# Patient Record
Sex: Male | Born: 1990 | Race: Black or African American | Hispanic: No | Marital: Single | Smoking: Never smoker
Health system: Southern US, Community
[De-identification: ages and names within clinical notes are randomized; demographics above are authoritative.]

---

## 2016-12-08 ENCOUNTER — Emergency Department (HOSPITAL_BASED_OUTPATIENT_CLINIC_OR_DEPARTMENT_OTHER)
Admission: EM | Admit: 2016-12-08 | Discharge: 2016-12-08 | Disposition: A | Discharge status: Home / Self Care | Payer: Self-pay | Attending: Emergency Medicine | Admitting: Emergency Medicine

## 2016-12-08 ENCOUNTER — Encounter (HOSPITAL_BASED_OUTPATIENT_CLINIC_OR_DEPARTMENT_OTHER): Payer: Self-pay

## 2016-12-08 DIAGNOSIS — R112 Nausea with vomiting, unspecified: Secondary | ICD-10-CM | POA: Insufficient documentation

## 2016-12-08 DIAGNOSIS — R4182 Altered mental status, unspecified: Secondary | ICD-10-CM | POA: Insufficient documentation

## 2016-12-08 LAB — URINALYSIS, ROUTINE W REFLEX MICROSCOPIC
Bilirubin Urine: NEGATIVE
GLUCOSE, UA: NEGATIVE mg/dL
Hgb urine dipstick: NEGATIVE
LEUKOCYTES UA: NEGATIVE
NITRITE: NEGATIVE
PROTEIN: 30 mg/dL — AB
Specific Gravity, Urine: 1.03 (ref 1.005–1.030)
pH: 8 (ref 5.0–8.0)

## 2016-12-08 LAB — CBC
HCT: 46.5 % (ref 39.0–52.0)
Hemoglobin: 16.6 g/dL (ref 13.0–17.0)
MCH: 30.9 pg (ref 26.0–34.0)
MCHC: 35.7 g/dL (ref 30.0–36.0)
MCV: 86.6 fL (ref 78.0–100.0)
PLATELETS: 224 10*3/uL (ref 150–400)
RBC: 5.37 MIL/uL (ref 4.22–5.81)
RDW: 12 % (ref 11.5–15.5)
WBC: 15.6 10*3/uL — ABNORMAL HIGH (ref 4.0–10.5)

## 2016-12-08 LAB — COMPREHENSIVE METABOLIC PANEL
ALK PHOS: 39 U/L (ref 38–126)
ALT: 17 U/L (ref 17–63)
AST: 38 U/L (ref 15–41)
Albumin: 4.7 g/dL (ref 3.5–5.0)
Anion gap: 12 (ref 5–15)
BILIRUBIN TOTAL: 2.8 mg/dL — AB (ref 0.3–1.2)
BUN: 13 mg/dL (ref 6–20)
CALCIUM: 9.6 mg/dL (ref 8.9–10.3)
CO2: 25 mmol/L (ref 22–32)
CREATININE: 1.27 mg/dL — AB (ref 0.61–1.24)
Chloride: 99 mmol/L — ABNORMAL LOW (ref 101–111)
GFR calc non Af Amer: >60 mL/min (ref >60)
Glucose, Bld: 116 mg/dL — ABNORMAL HIGH (ref 65–99)
Potassium: 3.6 mmol/L (ref 3.5–5.1)
SODIUM: 136 mmol/L (ref 135–145)
TOTAL PROTEIN: 7.5 g/dL (ref 6.5–8.1)

## 2016-12-08 LAB — ETHANOL

## 2016-12-08 LAB — URINALYSIS, MICROSCOPIC (REFLEX)
RBC / HPF: NONE SEEN RBC/hpf (ref 0–5)
WBC UA: NONE SEEN WBC/hpf (ref 0–5)

## 2016-12-08 LAB — RAPID URINE DRUG SCREEN, HOSP PERFORMED
AMPHETAMINES: NOT DETECTED
BENZODIAZEPINES: NOT DETECTED
Barbiturates: NOT DETECTED
COCAINE: NOT DETECTED
OPIATES: NOT DETECTED
Tetrahydrocannabinol: POSITIVE — AB

## 2016-12-08 LAB — LIPASE, BLOOD: LIPASE: 25 U/L (ref 11–51)

## 2016-12-08 MED ORDER — ONDANSETRON 4 MG PO TBDP: 4.0000 mg | ORAL_TABLET | Freq: Three times a day (TID) | ORAL | 0 refills | Status: AC | PRN

## 2016-12-08 MED ORDER — GI COCKTAIL ~~LOC~~
30.0000 mL | Freq: Once | ORAL | Status: DC
  Filled 2016-12-08: qty 30

## 2016-12-08 MED ORDER — SODIUM CHLORIDE 0.9 % IV BOLUS (SEPSIS)
1000.0000 mL | Freq: Once | INTRAVENOUS | Status: AC
  Administered 2016-12-08: 1000 mL via INTRAVENOUS

## 2016-12-08 MED ORDER — ONDANSETRON HCL 4 MG/2ML IJ SOLN
4.0000 mg | Freq: Once | INTRAMUSCULAR | Status: AC
  Administered 2016-12-08: 4 mg via INTRAVENOUS
  Filled 2016-12-08: qty 2

## 2016-12-08 NOTE — ED Notes (Signed)
Sprite given per order 

## 2016-12-08 NOTE — ED Provider Notes (Signed)
[facility]   CSN: [id number] Arrival date & time: 12/08/16  1818  By signing my name below, I, Vista Minkobert Ross, attest that this documentation has been prepared under the direction and in the presence of Mia McDonald PA-C  Electronically Signed: Vista Minkobert Ross, ED Scribe. 12/08/16. 7:21 PM.  History   Chief Complaint Chief Complaint  Patient presents with  . Abdominal Pain  . Altered Mental Status   HPI HPI Comments: Erik Ponce is a 26 y.o. male who presents to the Emergency Department complaining of 8/10, crampy abdominal pain that started around 0300 this morning. Pt reports bright green vomit since 0300 this morning, which has reoccurred once per hour since his symptoms started. Pt has had one episode of bright red blood in his vomit today. He has had decreased oral intake; brother states that he has had trouble keeping fluids or food down. His last BM was this morning around 0900, reports that this was a hard BM. He is still currently feeling nauseas. He had similar symptoms approximately two months ago with food poisoning. Pt further reports chills, no fever on arrival here. Brother reports pt was drinking last night, only 1/4 of a 40oz beer. He has not had any problems with constipation. No blood in BM.  No diarrhea. No other known sick contacts. No recent travel outside of the country. No daily medications. He denies any illicit drug use. No dysuria, penile discharge.  The history is provided by the patient. No language interpreter was used.    History reviewed. No pertinent past medical history.  There are no active problems to display for this patient.   History reviewed. No pertinent surgical history.   Home Medications    Prior to Admission medications   Medication Sig Start Date End Date Taking? Authorizing Provider  ondansetron (ZOFRAN ODT) 4 MG disintegrating tablet Take 1 tablet (4 mg total) by mouth every 8 (eight) hours as needed for nausea  or vomiting. 12/08/16   McDonald, Mia A, PA-C    Family History No family history on file.  Social History Social History  Substance Use Topics  . Smoking status: Never Smoker  . Smokeless tobacco: Never Used  . Alcohol use Yes    Allergies   Patient has no known allergies.   Review of Systems Review of Systems  Constitutional: Positive for chills. Negative for fever.  Gastrointestinal: Positive for nausea and vomiting. Negative for blood in stool and diarrhea.  Genitourinary: Negative for discharge and dysuria.     Physical Exam Updated Vital Signs BP 133/75 (BP Location: Right Arm)   Pulse 80   Temp 99.3 F (37.4 C) (Oral)   Resp (!) 24   Ht 5\' 10"  (1.778 m)   Wt 160 lb (72.6 kg)   SpO2 100%   BMI 22.96 kg/m   Physical Exam  Constitutional: He appears well-developed.  Appears uncomfortable  HENT:  Head: Normocephalic.  Eyes: Conjunctivae are normal.  Neck: Neck supple.  Cardiovascular: Normal rate, regular rhythm, normal heart sounds and intact distal pulses.  Exam reveals no gallop and no friction rub.   No murmur heard. Pulmonary/Chest: Effort normal and breath sounds normal. No respiratory distress. He has no wheezes. He has no rales.  Mildly tachypneic.   Abdominal: Soft. Bowel sounds are normal. He exhibits no distension. There is tenderness. There is no rebound and no guarding.  Diffuse tenderness. No focal tenderness. No CVA tenderness.   Neurological: He is alert.  Skin: Skin is  warm and dry. Capillary refill takes 2 to 3 seconds.  Psychiatric: His behavior is normal.  Nursing note and vitals reviewed.  ED Treatments / Results  DIAGNOSTIC STUDIES: Oxygen Saturation is 100% on RA, normal by my interpretation.  COORDINATION OF CARE: 7:16 PM-Discussed treatment plan with pt at bedside and pt agreed to plan.   Labs (all labs ordered are listed, but only abnormal results are displayed) Labs Reviewed  URINALYSIS, ROUTINE W REFLEX MICROSCOPIC -  Abnormal; Notable for the following:       Result Value   Ketones, ur >80 (*)    Protein, ur 30 (*)    All other components within normal limits  RAPID URINE DRUG SCREEN, HOSP PERFORMED - Abnormal; Notable for the following:    Tetrahydrocannabinol POSITIVE (*)    All other components within normal limits  COMPREHENSIVE METABOLIC PANEL - Abnormal; Notable for the following:    Chloride 99 (*)    Glucose, Bld 116 (*)    Creatinine, Ser 1.27 (*)    Total Bilirubin 2.8 (*)    All other components within normal limits  CBC - Abnormal; Notable for the following:    WBC 15.6 (*)    All other components within normal limits  URINALYSIS, MICROSCOPIC (REFLEX) - Abnormal; Notable for the following:    Bacteria, UA RARE (*)    Squamous Epithelial / LPF 0-5 (*)    All other components within normal limits  ETHANOL  LIPASE, BLOOD  CBG MONITORING, ED    EKG  EKG Interpretation None       Radiology No results found.  Procedures Procedures (including critical care time)  Medications Ordered in ED Medications  gi cocktail (Maalox,Lidocaine,Donnatal) (30 mLs Oral Not Given 12/08/16 2230)  sodium chloride 0.9 % bolus 1,000 mL (0 mLs Intravenous Stopped 12/08/16 2011)  ondansetron (ZOFRAN) injection 4 mg (4 mg Intravenous Given 12/08/16 1920)  sodium chloride 0.9 % bolus 1,000 mL (0 mLs Intravenous Stopped 12/08/16 2222)     Initial Impression / Assessment and Plan / ED Course  I have reviewed the triage vital signs and the nursing notes.  Pertinent labs & imaging results that were available during my care of the patient were reviewed by me and considered in my medical decision making (see chart for details).     Patient is nontoxic, nonseptic appearing, in no apparent distress.  Patient's pain and other symptoms adequately managed in emergency department.  Fluid bolus given.  Labs, imaging and vitals reviewed.  Cr. 1.27. Cl 99. Leukocytosis of 15.6, likely due to hemodilution  secondary to dehydration. The patient reports drinking alcohol heavily last night. Patient does not meet the SIRS or Sepsis criteria.  On repeat exam patient does not have a surgical abdomen and there are no peritoneal signs.  No indication of appendicitis, bowel obstruction, bowel perforation, cholecystitis, diverticulitis.  Successfully PO challenged. Patient discharged home with symptomatic treatment and given strict instructions for follow-up with their primary care physician.  I have also discussed reasons to return immediately to the ER.  Patient expresses understanding and agrees with plan.  Final Clinical Impressions(s) / ED Diagnoses   Final diagnoses:  Nausea and vomiting, intractability of vomiting not specified, unspecified vomiting type    New Prescriptions Discharge Medication List as of 12/08/2016 10:51 PM    START taking these medications   Details  ondansetron (ZOFRAN ODT) 4 MG disintegrating tablet Take 1 tablet (4 mg total) by mouth every 8 (eight) hours as needed for nausea  or vomiting., Starting Mon 12/08/2016, Print      I personally performed the services described in this documentation, which was scribed in my presence. The recorded information has been reviewed and is accurate.     Frederik Pear A, PA-C 12/09/16 0140    Rolan Bucco, MD 12/11/16 [phone]

## 2016-12-08 NOTE — ED Notes (Signed)
Pt still withdrawn but will answer questions. Keeps his head under blanket while speaking with RN. RN requested urine sample. Pt states he cannot void right now. 1L NS bolus hung per order. Specimen cup left for patient to use when he is able to void.

## 2016-12-08 NOTE — ED Notes (Signed)
RN requesting urine sample. Pt states he is unable to void at this time. 1L NS bolus hung per order. Pt reminded to call when he is able to urinate.

## 2016-12-08 NOTE — ED Notes (Signed)
Pt able to drink fluids without vomiting. EDP notified. Pt wishes to be discharged.

## 2016-12-08 NOTE — Discharge Instructions (Signed)
Please continue to stay hydrated and drink plenty of liquids. You can take Zofran once every 8 hours to help with nausea and vomiting. If he develop nausea or vomiting despite taking Zofran, fever, chills or new or worsening symptoms, please return to the emergency department for reevaluation.  If you are not established with a primary care provider, you can call [facility] to get established with primary care. There was a small amount of protein and ketones in your urine today. Please follow up and have your urinalysis repeated in the next month with primary care.

## 2016-12-08 NOTE — ED Notes (Signed)
ED Provider at bedside. 

## 2016-12-08 NOTE — ED Notes (Addendum)
Patient ambulatory to bathroom with brother.  Patient stumbling.  Offered wheelchair-patient refused.  Patient states he isn't going to the bathroom and that he just wants to get up and walk.  Patient in bathroom with brother.  Refused help from RN.  On return from bathroom patient states "I am not peeing in that cup for you".  Patient back in bed with brother at the bedside.

## 2016-12-08 NOTE — ED Triage Notes (Addendum)
C/o abd pain, n/v started yesterday-presents to triage in w/c-bent over eyes closed not answering my ?s-pt's brother answering ?s-pt did eventually talk to staff at times with brother answering most ?s at pt's request

## 2017-06-09 ENCOUNTER — Ambulatory Visit: Payer: Self-pay | Admitting: Nurse Practitioner

## 2017-06-09 VITALS — BP 110/80 | HR 98 | Temp 98.8°F | Resp 16 | Wt 232.0 lb

## 2017-06-09 DIAGNOSIS — J029 Acute pharyngitis, unspecified: Secondary | ICD-10-CM

## 2017-06-09 DIAGNOSIS — J309 Allergic rhinitis, unspecified: Secondary | ICD-10-CM

## 2017-06-09 NOTE — Patient Instructions (Addendum)
Allergic Rhinitis, Adult Allergic rhinitis is an allergic reaction that affects the mucous membrane inside the nose. It causes sneezing, a runny or stuffy nose, and the feeling of mucus going down the back of the throat (postnasal drip). Allergic rhinitis can be mild to severe. There are two types of allergic rhinitis:  Seasonal. This type is also called hay fever. It happens only during certain seasons.  Perennial. This type can happen at any time of the year.  What are the causes? This condition happens when the body's defense system (immune system) responds to certain harmless substances called allergens as though they were germs.  Seasonal allergic rhinitis is triggered by pollen, which can come from grasses, trees, and weeds. Perennial allergic rhinitis may be caused by:  House dust mites.  Pet dander.  Mold spores.  What are the signs or symptoms? Symptoms of this condition include:  Sneezing.  Runny or stuffy nose (nasal congestion).  Postnasal drip.  Itchy nose.  Tearing of the eyes.  Trouble sleeping.  Daytime sleepiness.  How is this diagnosed? This condition may be diagnosed based on:  Your medical history.  A physical exam.  Tests to check for related conditions, such as: ? Asthma. ? Pink eye. ? Ear infection. ? Upper respiratory infection.  Tests to find out which allergens trigger your symptoms. These may include skin or blood tests.  How is this treated? There is no cure for this condition, but treatment can help control symptoms. Treatment may include:  Taking medicines that block allergy symptoms, such as antihistamines. Medicine may be given as a shot, nasal spray, or pill.  Avoiding the allergen.  Desensitization. This treatment involves getting ongoing shots until your body becomes less sensitive to the allergen. This treatment may be done if other treatments do not help.  If taking medicine and avoiding the allergen does not work, new,  stronger medicines may be prescribed.  Follow these instructions at home:  Find out what you are allergic to. Common allergens include smoke, dust, and pollen.  Avoid the things you are allergic to. These are some things you can do to help avoid allergens: ? Replace carpet with wood, tile, or vinyl flooring. Carpet can trap dander and dust. ? Do not smoke. Do not allow smoking in your home. ? Change your heating and air conditioning filter at least once a month. ? During allergy season:  Keep windows closed as much as possible.  Plan outdoor activities when pollen counts are lowest. This is usually during the evening hours.  When coming indoors, change clothing and shower before sitting on furniture or bedding.  Take over-the-counter and prescription medicines only as told by your health care provider.  Keep all follow-up visits as told by your health care provider. This is important. Contact a health care provider if:  You have a fever.  You develop a persistent cough.  You make whistling sounds when you breathe (you wheeze).  Your symptoms interfere with your normal daily activities. Get help right away if:  You have shortness of breath. Summary  This condition can be managed by taking medicines as directed and avoiding allergens.  Contact your health care provider if you develop a persistent cough or fever.  During allergy season, keep windows closed as much as possible. This information is not intended to replace advice given to you by your health care provider. Make sure you discuss any questions you have with your health care provider. Document Released: 01/28/2001 Document Revised: 06/12/2016  Document Reviewed: 06/12/2016 Elsevier Interactive Patient Education  2018 [employer]  Pharyngitis Pharyngitis is redness, pain, and swelling (inflammation) of the throat (pharynx). It is a very common cause of sore throat. Pharyngitis can be caused by a bacteria, but it  is usually caused by a virus. Most cases of pharyngitis get better on their own without treatment. What are the causes? This condition may be caused by:  Infection by viruses (viral). Viral pharyngitis spreads from person to person (is contagious) through coughing, sneezing, and sharing of personal items or utensils such as cups, forks, spoons, and toothbrushes.  Infection by bacteria (bacterial). Bacterial pharyngitis may be spread by touching the nose or face after coming in contact with the bacteria, or through more intimate contact, such as kissing.  Allergies. Allergies can cause buildup of mucus in the throat (post-nasal drip), leading to inflammation and irritation. Allergies can also cause blocked nasal passages, forcing breathing through the mouth, which dries and irritates the throat.  What increases the risk? You are more likely to develop this condition if:  You are 625-26 years old.  You are exposed to crowded environments such as daycare, school, or dormitory living.  You live in a cold climate.  You have a weakened disease-fighting (immune) system.  What are the signs or symptoms? Symptoms of this condition vary by the cause (viral, bacterial, or allergies) and can include:  Sore throat.  Fatigue.  Low-grade fever.  Headache.  Joint pain and muscle aches.  Skin rashes.  Swollen glands in the throat (lymph nodes).  Plaque-like film on the throat or tonsils. This is often a symptom of bacterial pharyngitis.  Vomiting.  Stuffy nose (nasal congestion).  Cough.  Red, itchy eyes (conjunctivitis).  Loss of appetite.  How is this diagnosed? This condition is often diagnosed based on your medical history and a physical exam. Your health care provider will ask you questions about your illness and your symptoms. A swab of your throat may be done to check for bacteria (rapid strep test). Other lab tests may also be done, depending on the suspected cause, but these  are rare. How is this treated? This condition usually gets better in 3-4 days without medicine. Bacterial pharyngitis may be treated with antibiotic medicines. Follow these instructions at home:  Take over-the-counter and prescription medicines only as told by your health care provider. ? If you were prescribed an antibiotic medicine, take it as told by your health care provider. Do not stop taking the antibiotic even if you start to feel better. ? Do not give children aspirin because of the association with Reye syndrome.  Drink enough water and fluids to keep your urine clear or pale yellow.  Get a lot of rest.  Gargle with a salt-water mixture 3-4 times a day or as needed. To make a salt-water mixture, completely dissolve -1 tsp of salt in 1 cup of warm water.  If your health care provider approves, you may use throat lozenges or sprays to soothe your throat.  Take Ibuprofen 800mg  every 8 hours for three days.  Use humidifier at home for cough. Contact a health care provider if:  You have large, tender lumps in your neck.  You have a rash.  You cough up green, yellow-brown, or bloody spit. Get help right away if:  Your neck becomes stiff.  You drool or are unable to swallow liquids.  You cannot drink or take medicines without vomiting.  You have severe pain that does not go  away, even after you take medicine.  You have trouble breathing, and it is not caused by a stuffy nose.  You have new pain and swelling in your joints such as the knees, ankles, wrists, or elbows. Summary  Pharyngitis is redness, pain, and swelling (inflammation) of the throat (pharynx).  While pharyngitis can be caused by a bacteria, the most common causes are viral.  Most cases of pharyngitis get better on their own without treatment.  Bacterial pharyngitis is treated with antibiotic medicines. This information is not intended to replace advice given to you by your health care provider. Make  sure you discuss any questions you have with your health care provider. Document Released: 05/05/2005 Document Revised: 06/10/2016 Document Reviewed: 06/10/2016 Elsevier Interactive Patient Education  [employer].

## 2017-06-09 NOTE — Progress Notes (Signed)
Subjective:     Erik Ponce is a 27 y.o. male who presents for evaluation of sore throat. Associated symptoms include bilateral ear fullness and pressure, enlarged tonsils, sinus and nasal congestion and sore throat. Onset of symptoms was 4 days ago, and have been gradually worsening since that time. He is drinking plenty of fluids. He has not had a recent close exposure to someone with proven streptococcal pharyngitis. Patient has not taken any medications for his symptoms.  The following portions of the patient's history were reviewed and updated as appropriate: allergies, current medications and past medical history.  Review of Systems Constitutional: positive for anorexia and fatigue, negative for chills, fevers, malaise and sweats Eyes: negative Ears, nose, mouth, throat, and face: positive for nasal congestion, sore throat and ear pressure/fullness, negative for ear drainage and hoarseness Respiratory: positive for cough Cardiovascular: negative Neurological: positive for headaches Behavioral/Psych: negative Allergic/Immunologic: positive for hay fever    Objective:    BP 110/80 (BP Location: Right Arm, Patient Position: Sitting, Cuff Size: Normal)   Pulse 98   Temp 98.8 F (37.1 C) (Oral)   Resp 16   Wt 232 lb (105.2 kg)   SpO2 98%   BMI 33.29 kg/m  General appearance: alert, cooperative, fatigued and no distress Head: Normocephalic, without obvious abnormality, atraumatic Eyes: conjunctivae/corneas clear. PERRL, EOM's intact. Fundi benign. Ears: normal TM's and external ear canals both ears Nose: turbinates swollen, inflamed, mild maxillary sinus tenderness bilateral, mild frontal sinus tenderness bilateral Throat: abnormal findings: moderate oropharyngeal erythema,uvula favoring right tonsil Lungs: clear to auscultation bilaterally Heart: regular rate and rhythm, S1, S2 normal, no murmur, click, rub or gallop Pulses: 2+ and symmetric Skin: Skin color, texture, turgor  normal. No rashes or lesions Lymph nodes: cervical lymphadenopathy Neurologic: Grossly normal  Laboratory Strep test not done. Results: n/a.    Assessment:    Acute pharyngitis, likely  and Allergic Rhinitis.    Plan:    Use of OTC analgesics recommended as well as salt water gargles. Use of decongestant recommended. Patient advised of the risk of peritonsillar abscess formation. Patient indicated he was not able to purchase any prescriptions or additional OTC medications.  Instructed patient to use Ibuprofen 800mg  every 8 hours for 3 days and continue OTC Nyquil or Robitussin as needed.  Work not provided.  Patient will follow up if needed.

## 2019-04-19 ENCOUNTER — Emergency Department (HOSPITAL_BASED_OUTPATIENT_CLINIC_OR_DEPARTMENT_OTHER): Payer: Self-pay

## 2019-04-19 ENCOUNTER — Encounter (HOSPITAL_BASED_OUTPATIENT_CLINIC_OR_DEPARTMENT_OTHER): Payer: Self-pay | Admitting: *Deleted

## 2019-04-19 ENCOUNTER — Emergency Department (HOSPITAL_BASED_OUTPATIENT_CLINIC_OR_DEPARTMENT_OTHER)
Admission: EM | Admit: 2019-04-19 | Discharge: 2019-04-19 | Disposition: A | Discharge status: Home / Self Care | Payer: Self-pay | Attending: Emergency Medicine | Admitting: Emergency Medicine

## 2019-04-19 ENCOUNTER — Other Ambulatory Visit: Payer: Self-pay

## 2019-04-19 DIAGNOSIS — R1011 Right upper quadrant pain: Secondary | ICD-10-CM | POA: Insufficient documentation

## 2019-04-19 DIAGNOSIS — R1013 Epigastric pain: Secondary | ICD-10-CM | POA: Insufficient documentation

## 2019-04-19 LAB — LIPASE, BLOOD: Lipase: 26 U/L (ref 11–51)

## 2019-04-19 LAB — COMPREHENSIVE METABOLIC PANEL
ALT: 14 U/L (ref 0–44)
AST: 17 U/L (ref 15–41)
Albumin: 4.9 g/dL (ref 3.5–5.0)
Alkaline Phosphatase: 57 U/L (ref 38–126)
Anion gap: 11 (ref 5–15)
BUN: 15 mg/dL (ref 6–20)
CO2: 22 mmol/L (ref 22–32)
Calcium: 9.8 mg/dL (ref 8.9–10.3)
Chloride: 103 mmol/L (ref 98–111)
Creatinine, Ser: 1.14 mg/dL (ref 0.61–1.24)
GFR calc Af Amer: >60 mL/min (ref >60)
GFR calc non Af Amer: >60 mL/min (ref >60)
Glucose, Bld: 110 mg/dL — ABNORMAL HIGH (ref 70–99)
Potassium: 3.7 mmol/L (ref 3.5–5.1)
Sodium: 136 mmol/L (ref 135–145)
Total Bilirubin: 2.3 mg/dL — ABNORMAL HIGH (ref 0.3–1.2)
Total Protein: 8.4 g/dL — ABNORMAL HIGH (ref 6.5–8.1)

## 2019-04-19 LAB — CBC
HCT: 54.1 % — ABNORMAL HIGH (ref 39.0–52.0)
Hemoglobin: 18.4 g/dL — ABNORMAL HIGH (ref 13.0–17.0)
MCH: 30.5 pg (ref 26.0–34.0)
MCHC: 34 g/dL (ref 30.0–36.0)
MCV: 89.6 fL (ref 80.0–100.0)
Platelets: 256 10*3/uL (ref 150–400)
RBC: 6.04 MIL/uL — ABNORMAL HIGH (ref 4.22–5.81)
RDW: 12.1 % (ref 11.5–15.5)
WBC: 15.9 10*3/uL — ABNORMAL HIGH (ref 4.0–10.5)
nRBC: 0 % (ref 0.0–0.2)

## 2019-04-19 MED ORDER — SODIUM CHLORIDE 0.9 % IV BOLUS
1000.0000 mL | Freq: Once | INTRAVENOUS | Status: AC
  Administered 2019-04-19: 12:00:00 1000 mL via INTRAVENOUS

## 2019-04-19 MED ORDER — ONDANSETRON 4 MG PO TBDP: 4.0000 mg | ORAL_TABLET | Freq: Three times a day (TID) | ORAL | 0 refills | Status: AC | PRN

## 2019-04-19 MED ORDER — HALOPERIDOL LACTATE 5 MG/ML IJ SOLN
2.0000 mg | Freq: Once | INTRAMUSCULAR | Status: AC
  Administered 2019-04-19: 2 mg via INTRAVENOUS
  Filled 2019-04-19: qty 1

## 2019-04-19 MED FILL — ONDANSETRON ODT 4 MG TABLET: 4 | 7 days supply | Qty: 20 | Fill #0

## 2019-04-19 NOTE — ED Triage Notes (Signed)
Abdominal pain. Hx of recent ulcer. He feels like the ulcer is the cause of his pain.

## 2019-04-19 NOTE — ED Notes (Signed)
Pt on monitor 

## 2019-04-19 NOTE — ED Provider Notes (Signed)
[facility]PT [facility] Emergency Department Provider Note MRN:  [id number]  Arrival date & time: 04/19/19     Chief Complaint   Abdominal Pain   History of Present Illness   Erik Ponce is a 28 y.o. year-old male with a history of gastric ulcer presenting to the ED with chief complaint of abdominal pain.  4 days of epigastric and right upper quadrant abdominal pain.  Associated with subjective fevers/night sweats, nausea, nonbloody and nonbilious emesis.  Denies chest pain or shortness of breath, no lower abdominal pain, no diarrhea, no black stools.  Symptoms are moderate, constant, no exacerbating relieving factors.  Feels similar to prior gastric ulcer.  Review of Systems  A complete 10 system review of systems was obtained and all systems are negative except as noted in the HPI and PMH.   Patient's Health History   History reviewed. No pertinent past medical history.  History reviewed. No pertinent surgical history.  No family history on file.  Social History   Socioeconomic History  . Marital status: Single    Spouse name: Not on file  . Number of children: Not on file  . Years of education: Not on file  . Highest education level: Not on file  Occupational History  . Not on file  Social Needs  . Financial resource strain: Not on file  . Food insecurity    Worry: Not on file    Inability: Not on file  . Transportation needs    Medical: Not on file    Non-medical: Not on file  Tobacco Use  . Smoking status: Never Smoker  . Smokeless tobacco: Never Used  Substance and Sexual Activity  . Alcohol use: Yes  . Drug use: Yes    Types: Marijuana  . Sexual activity: Not on file  Lifestyle  . Physical activity    Days per week: Not on file    Minutes per session: Not on file  . Stress: Not on file  Relationships  . Social Musician on phone: Not on file    Gets together: Not on file    Attends religious service: Not on file   Active member of club or organization: Not on file    Attends meetings of clubs or organizations: Not on file    Relationship status: Not on file  . Intimate partner violence    Fear of current or ex partner: Not on file    Emotionally abused: Not on file    Physically abused: Not on file    Forced sexual activity: Not on file  Other Topics Concern  . Not on file  Social History Narrative  . Not on file     Physical Exam  Vital Signs and Nursing Notes reviewed Vitals:   04/19/19 1140  BP: 133/79  Pulse: 63  Resp: 14  Temp: 98.8 F (37.1 C)  SpO2: 100%    CONSTITUTIONAL: Well-appearing, NAD NEURO:  Alert and oriented x 3, no focal deficits EYES:  eyes equal and reactive ENT/NECK:  no LAD, no JVD CARDIO: Regular rate, well-perfused, normal S1 and S2 PULM:  CTAB no wheezing or rhonchi GI/GU:  normal bowel sounds, non-distended, mild right upper quadrant/epigastric tenderness to palpation MSK/SPINE:  No gross deformities, no edema SKIN:  no rash, atraumatic PSYCH:  Appropriate speech and behavior  Diagnostic and Interventional Summary    EKG Interpretation  Date/Time:  Tuesday April 19 2019 12:06:52 EST Ventricular Rate:  63 PR Interval:  QRS Duration: 95 QT Interval:  403 QTC Calculation: 413 R Axis:   24 Text Interpretation: Sinus rhythm No previous ECGs available Confirmed by Gerlene Fee [id number]) on 04/19/2019 12:56:25 PM      Labs Reviewed  CBC - Abnormal; Notable for the following components:      Result Value   WBC 15.9 (*)    RBC 6.04 (*)    Hemoglobin 18.4 (*)    HCT 54.1 (*)    All other components within normal limits  COMPREHENSIVE METABOLIC PANEL - Abnormal; Notable for the following components:   Glucose, Bld 110 (*)    Total Protein 8.4 (*)    Total Bilirubin 2.3 (*)    All other components within normal limits  LIPASE, BLOOD    XR Abdomen Series  Final Result      Medications  sodium chloride 0.9 % bolus 1,000 mL (1,000 mLs  Intravenous New Bag/Given 04/19/19 1213)  haloperidol lactate (HALDOL) injection 2 mg (2 mg Intravenous Given 04/19/19 1209)  sodium chloride 0.9 % bolus 1,000 mL (1,000 mLs Intravenous New Bag/Given 04/19/19 1209)     Procedures  /  Critical Care Procedures  ED Course and Medical Decision Making  I have reviewed the triage vital signs and the nursing notes.  Pertinent labs & imaging results that were available during my care of the patient were reviewed by me and considered in my medical decision making (see below for details).     Considering pancreatitis, gastritis, biliary pathology in this 28 year old male, mild tenderness, appears tired and dehydrated, will provide fluids, symptomatic management, labs pending.  Patient is feeling much better.  Labs reassuring, x-ray normal.  Patient is requesting discharge.  Repeat abdominal exam is without any tenderness.  Appropriate for outpatient management.  Barth Kirks. [place] Small, [place] [email or web address]  Final Clinical Impressions(s) / ED Diagnoses     ICD-10-CM   1. Epigastric pain  R10.13 XR Abdomen Series    XR Abdomen Series    ED Discharge Orders         Ordered    ondansetron (ZOFRAN ODT) 4 MG disintegrating tablet  Every 8 hours PRN     04/19/19 1316           Discharge Instructions Discussed with and Provided to Patient:     Discharge Instructions     You were evaluated in the Emergency Department and after careful evaluation, we did not find any emergent condition requiring admission or further testing in the hospital.  Your exam/testing today was overall reassuring.  You can use the Zofran medication as needed for nausea.  Please call the GI doctors for future care.  Please return to the Emergency Department if you experience any worsening of your condition.  We encourage you to follow up with a primary care provider.  Thank you for allowing [place] to be a part of your  care.        Maudie Flakes, MD 04/19/19 [phone]

## 2019-04-19 NOTE — Discharge Instructions (Addendum)
You were evaluated in the Emergency Department and after careful evaluation, we did not find any emergent condition requiring admission or further testing in the hospital.  Your exam/testing today was overall reassuring.  You can use the Zofran medication as needed for nausea.  Please call the GI doctors for future care.  Please return to the Emergency Department if you experience any worsening of your condition.  We encourage you to follow up with a primary care provider.  Thank you for allowing [place] to be a part of your care.

## 2020-10-29 IMAGING — DX DG ABDOMEN ACUTE W/ 1V CHEST
4 series · 4 of 4 positions shown · non-contrast
Comparison: None.

CLINICAL DATA: Abdominal pain

EXAM:
DG ABDOMEN ACUTE W/ 1V CHEST

[abdomen kub (1 of 2)]
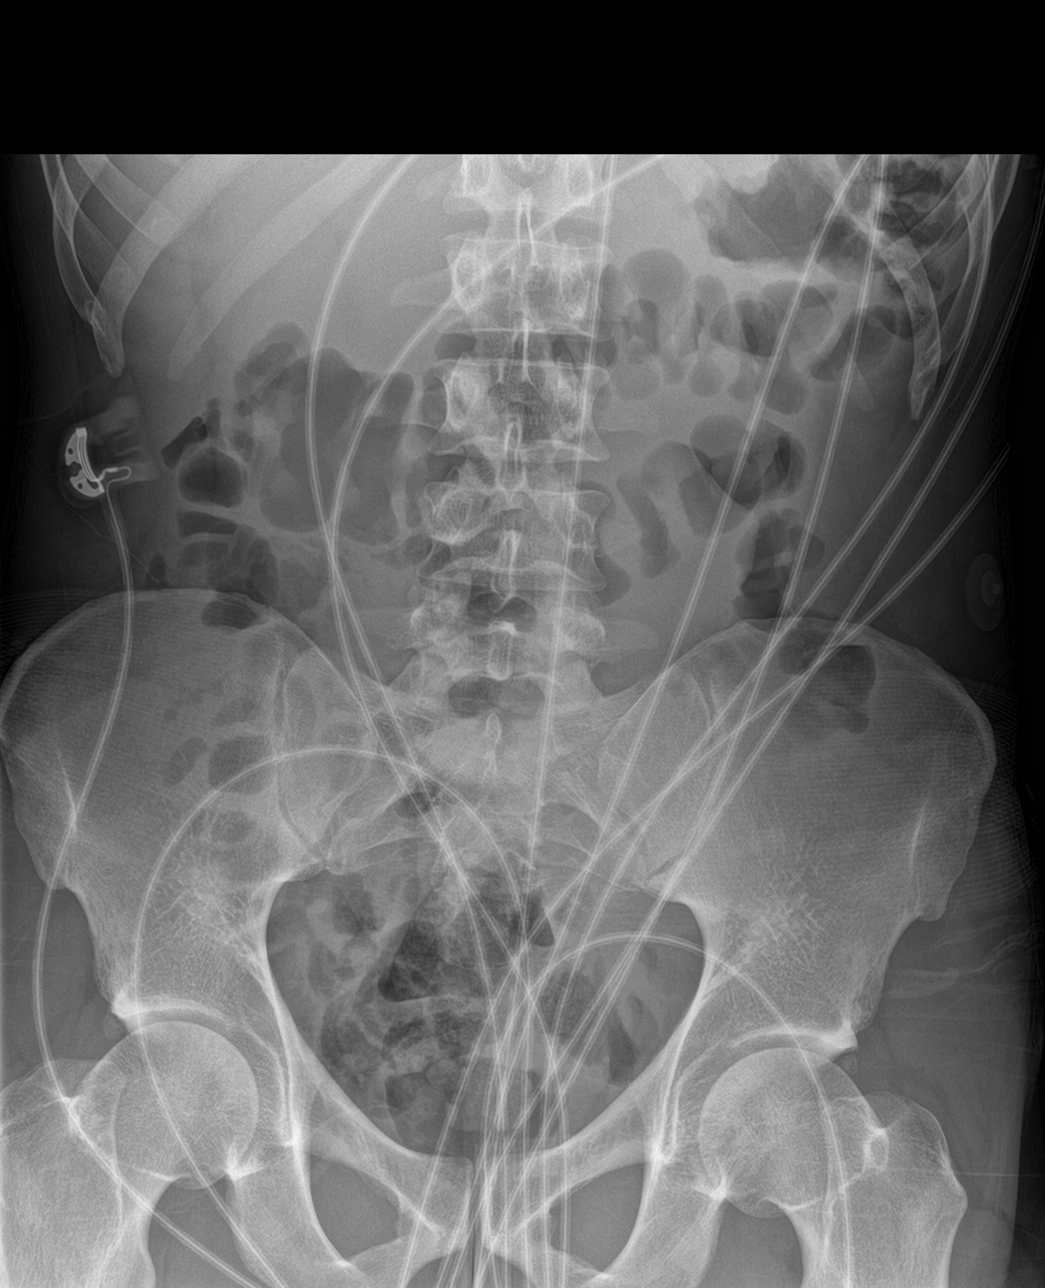

[abdomen erect]
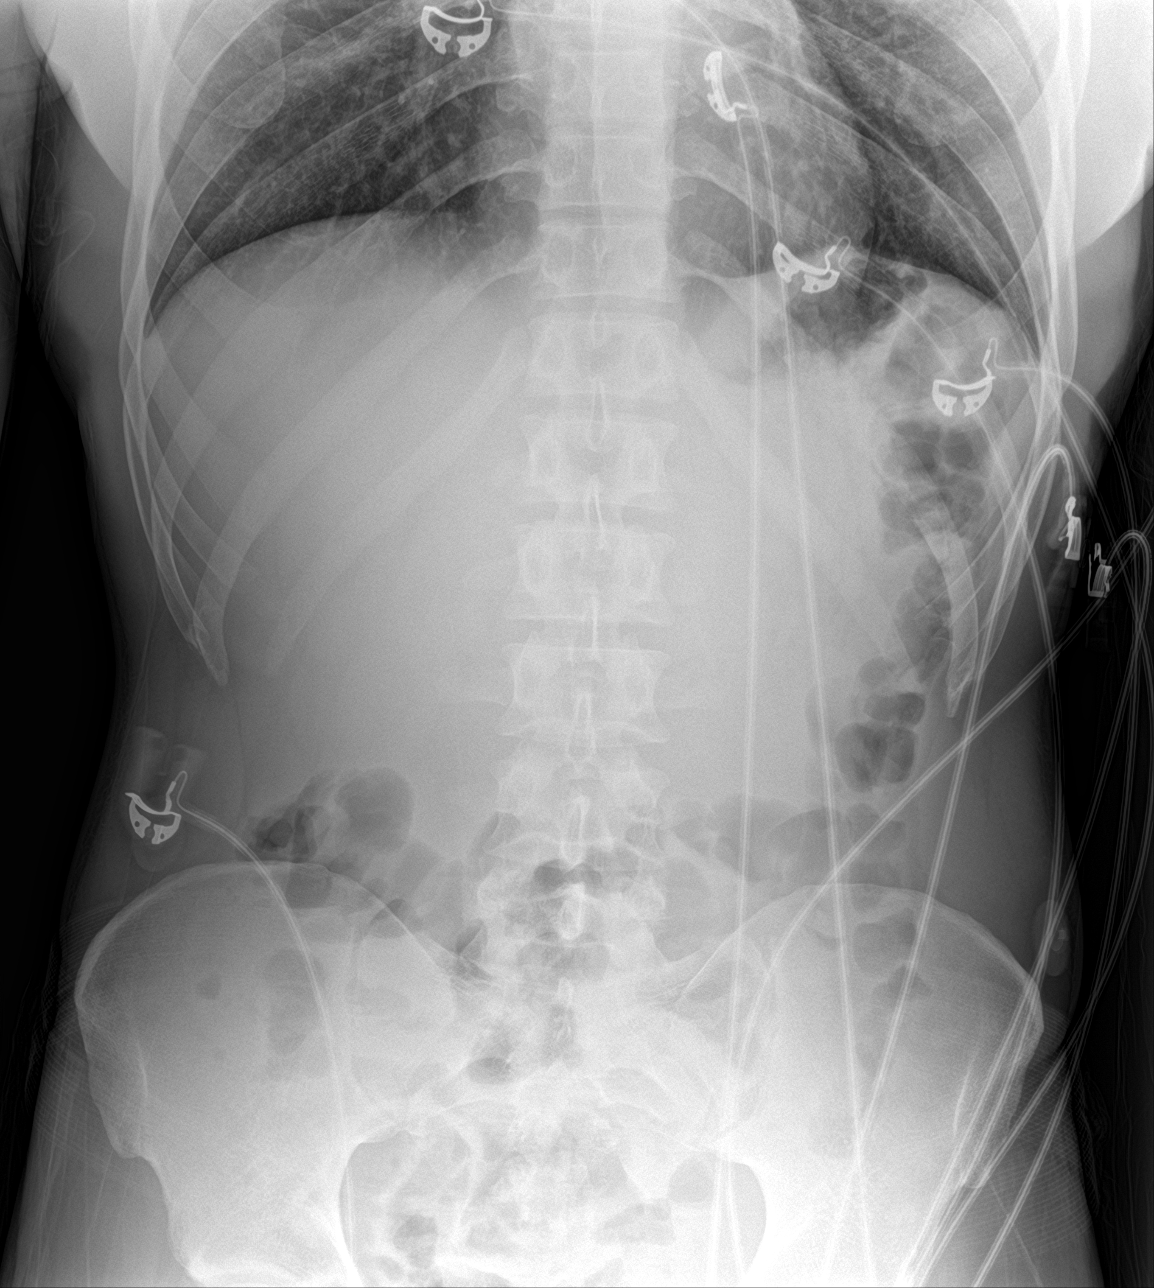

[chest pa]
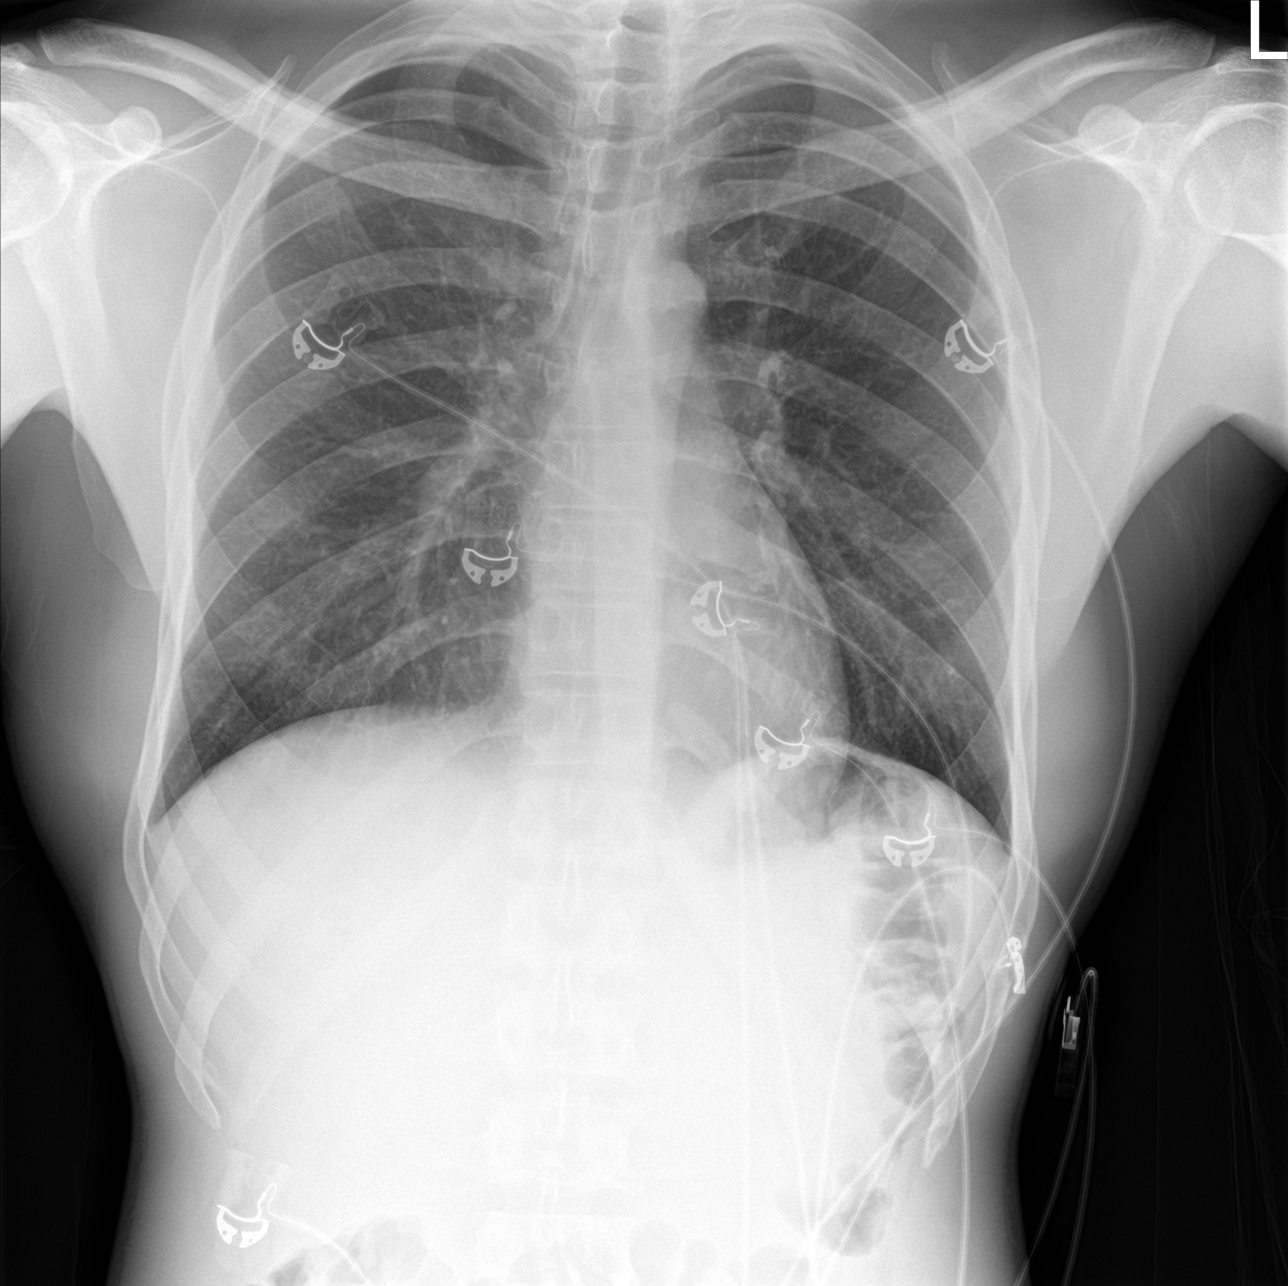

[abdomen kub (2 of 2)]
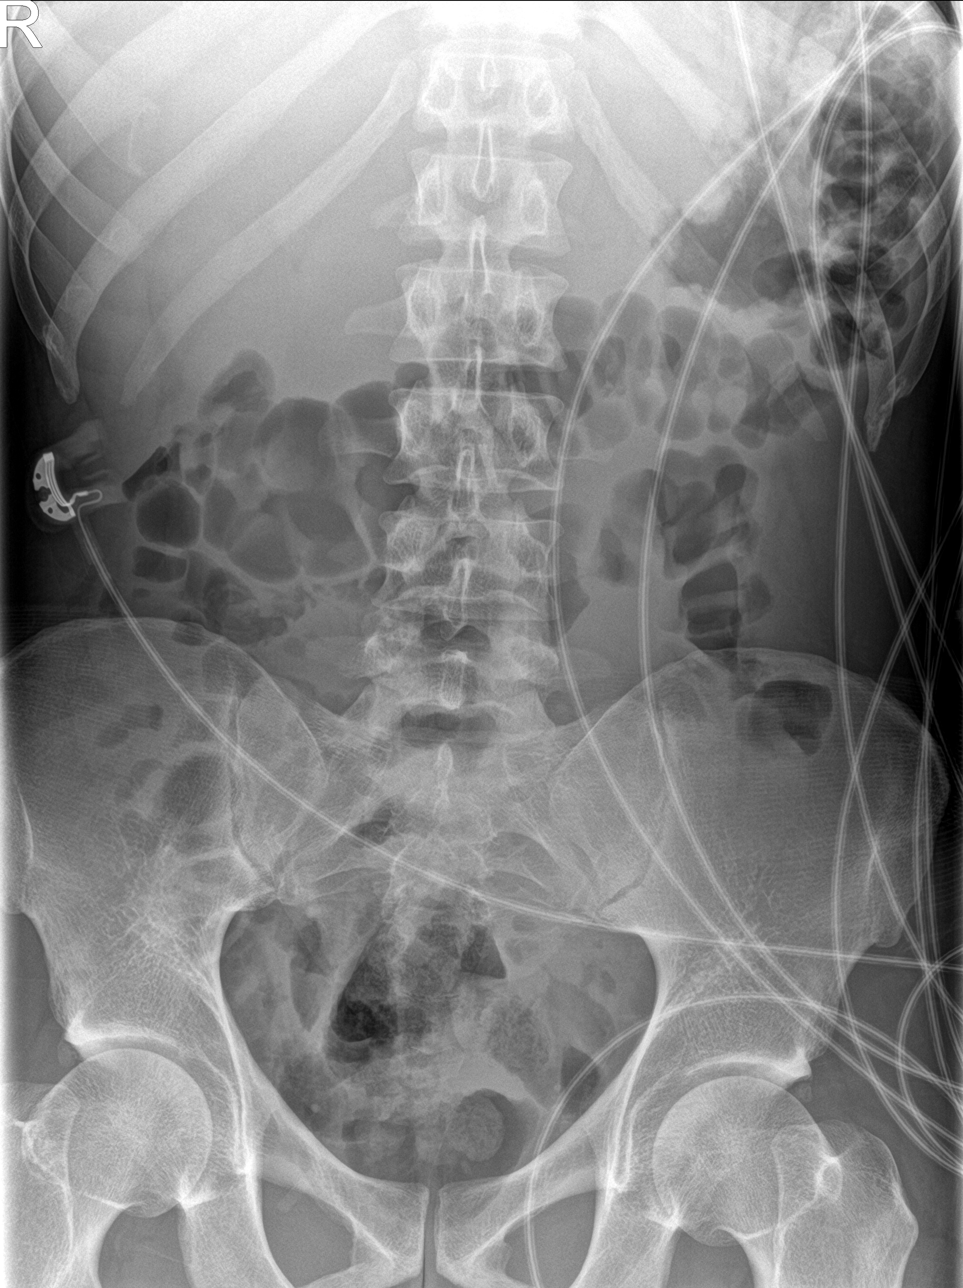

[4 of 4 positions shown; findings below may reference images not displayed]

FINDINGS: Cardiac shadows within normal limits. The lungs are well aerated
bilaterally. No focal infiltrate or sizable effusion is seen. No
acute bony abnormality is noted.

Scattered large and small gas is seen. No obstructive changes are
noted. No free air is seen. No abnormal mass or abnormal
calcifications are noted. Bony structures are within normal limits.
IMPRESSION: No acute abnormality noted.
